# Patient Record
Sex: Male | Born: 1985 | Race: White | Hispanic: No | Marital: Single | Smoking: Never smoker
Health system: Southern US, Community
[De-identification: ages and names within clinical notes are randomized; demographics above are authoritative.]

## PROBLEM LIST (undated history)

## (undated) HISTORY — PX: TONSILLECTOMY: SUR1361

---

## 2006-11-22 ENCOUNTER — Encounter: Admission: RE | Admit: 2006-11-22 | Discharge: 2006-11-22 | Payer: Self-pay | Admitting: Orthopedic Surgery

## 2006-12-25 ENCOUNTER — Encounter: Admission: RE | Admit: 2006-12-25 | Discharge: 2006-12-25 | Payer: Self-pay | Admitting: Orthopedic Surgery

## 2010-11-05 ENCOUNTER — Encounter: Payer: Self-pay | Admitting: Orthopedic Surgery

## 2017-08-30 DIAGNOSIS — J209 Acute bronchitis, unspecified: Secondary | ICD-10-CM | POA: Diagnosis not present

## 2017-08-30 DIAGNOSIS — J0100 Acute maxillary sinusitis, unspecified: Secondary | ICD-10-CM | POA: Diagnosis not present

## 2017-12-14 DIAGNOSIS — J069 Acute upper respiratory infection, unspecified: Secondary | ICD-10-CM | POA: Diagnosis not present

## 2018-03-03 DIAGNOSIS — M25332 Other instability, left wrist: Secondary | ICD-10-CM | POA: Diagnosis not present

## 2018-03-03 DIAGNOSIS — M25532 Pain in left wrist: Secondary | ICD-10-CM | POA: Diagnosis not present

## 2018-04-03 DIAGNOSIS — L814 Other melanin hyperpigmentation: Secondary | ICD-10-CM | POA: Diagnosis not present

## 2018-04-03 DIAGNOSIS — Z872 Personal history of diseases of the skin and subcutaneous tissue: Secondary | ICD-10-CM | POA: Diagnosis not present

## 2018-04-03 DIAGNOSIS — D225 Melanocytic nevi of trunk: Secondary | ICD-10-CM | POA: Diagnosis not present

## 2019-06-25 DIAGNOSIS — R198 Other specified symptoms and signs involving the digestive system and abdomen: Secondary | ICD-10-CM | POA: Diagnosis not present

## 2019-06-25 DIAGNOSIS — Z6826 Body mass index (BMI) 26.0-26.9, adult: Secondary | ICD-10-CM | POA: Diagnosis not present

## 2019-06-25 DIAGNOSIS — R1030 Lower abdominal pain, unspecified: Secondary | ICD-10-CM | POA: Diagnosis not present

## 2019-09-08 DIAGNOSIS — R1032 Left lower quadrant pain: Secondary | ICD-10-CM | POA: Diagnosis not present

## 2019-09-08 DIAGNOSIS — R12 Heartburn: Secondary | ICD-10-CM | POA: Diagnosis not present

## 2019-09-08 DIAGNOSIS — R197 Diarrhea, unspecified: Secondary | ICD-10-CM | POA: Diagnosis not present

## 2019-09-08 DIAGNOSIS — R1031 Right lower quadrant pain: Secondary | ICD-10-CM | POA: Diagnosis not present

## 2020-03-09 ENCOUNTER — Emergency Department (HOSPITAL_BASED_OUTPATIENT_CLINIC_OR_DEPARTMENT_OTHER)
Admission: EM | Admit: 2020-03-09 | Discharge: 2020-03-10 | Disposition: A | Discharge status: Home / Self Care | Payer: BC Managed Care – PPO | Attending: Emergency Medicine | Admitting: Emergency Medicine

## 2020-03-09 ENCOUNTER — Emergency Department (HOSPITAL_BASED_OUTPATIENT_CLINIC_OR_DEPARTMENT_OTHER): Payer: BC Managed Care – PPO

## 2020-03-09 ENCOUNTER — Encounter (HOSPITAL_BASED_OUTPATIENT_CLINIC_OR_DEPARTMENT_OTHER): Payer: Self-pay | Admitting: Emergency Medicine

## 2020-03-09 ENCOUNTER — Other Ambulatory Visit: Payer: Self-pay

## 2020-03-09 DIAGNOSIS — S199XXA Unspecified injury of neck, initial encounter: Secondary | ICD-10-CM | POA: Diagnosis not present

## 2020-03-09 DIAGNOSIS — S0003XA Contusion of scalp, initial encounter: Secondary | ICD-10-CM | POA: Diagnosis not present

## 2020-03-09 DIAGNOSIS — Y999 Unspecified external cause status: Secondary | ICD-10-CM | POA: Insufficient documentation

## 2020-03-09 DIAGNOSIS — S8001XA Contusion of right knee, initial encounter: Secondary | ICD-10-CM | POA: Diagnosis not present

## 2020-03-09 DIAGNOSIS — S0083XA Contusion of other part of head, initial encounter: Secondary | ICD-10-CM | POA: Diagnosis not present

## 2020-03-09 DIAGNOSIS — G930 Cerebral cysts: Secondary | ICD-10-CM

## 2020-03-09 DIAGNOSIS — R58 Hemorrhage, not elsewhere classified: Secondary | ICD-10-CM

## 2020-03-09 DIAGNOSIS — Y92410 Unspecified street and highway as the place of occurrence of the external cause: Secondary | ICD-10-CM | POA: Diagnosis not present

## 2020-03-09 DIAGNOSIS — Y9389 Activity, other specified: Secondary | ICD-10-CM | POA: Insufficient documentation

## 2020-03-09 DIAGNOSIS — S0990XA Unspecified injury of head, initial encounter: Secondary | ICD-10-CM | POA: Diagnosis not present
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
## 2020-03-10 ENCOUNTER — Encounter (HOSPITAL_BASED_OUTPATIENT_CLINIC_OR_DEPARTMENT_OTHER): Payer: Self-pay | Admitting: Emergency Medicine

## 2020-03-10 MED ORDER — DICLOFENAC SODIUM ER 100 MG PO TB24: 100.0000 mg | ORAL_TABLET | Freq: Every day | ORAL | 0 refills | Status: AC
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
## 2021-07-03 DIAGNOSIS — R438 Other disturbances of smell and taste: Secondary | ICD-10-CM | POA: Diagnosis not present

## 2021-07-03 DIAGNOSIS — Z20828 Contact with and (suspected) exposure to other viral communicable diseases: Secondary | ICD-10-CM | POA: Diagnosis not present

## 2021-07-03 DIAGNOSIS — R5383 Other fatigue: Secondary | ICD-10-CM | POA: Diagnosis not present

## 2021-07-03 DIAGNOSIS — J029 Acute pharyngitis, unspecified: Secondary | ICD-10-CM | POA: Diagnosis not present
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
